# Patient Record
Sex: Male | Born: 2000 | Race: White | Hispanic: No | Marital: Single | Smoking: Never smoker
Health system: Southern US, Community
[De-identification: ages and names within clinical notes are randomized; demographics above are authoritative.]

## PROBLEM LIST (undated history)

## (undated) HISTORY — PX: ELBOW SURGERY: SHX618

---
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
## 2020-05-21 ENCOUNTER — Ambulatory Visit: Payer: 59 | Admitting: Family Medicine

## 2020-05-23 ENCOUNTER — Encounter: Payer: Self-pay | Admitting: Family Medicine

## 2020-05-23 ENCOUNTER — Ambulatory Visit: Payer: 59 | Admitting: Family Medicine

## 2020-05-23 ENCOUNTER — Other Ambulatory Visit: Payer: Self-pay

## 2020-05-23 VITALS — BP 100/62 | HR 65 | Temp 98.3°F | Ht 71.0 in | Wt 165.0 lb

## 2020-05-23 DIAGNOSIS — G8929 Other chronic pain: Secondary | ICD-10-CM | POA: Diagnosis not present

## 2020-05-23 DIAGNOSIS — M25511 Pain in right shoulder: Secondary | ICD-10-CM

## 2020-05-23 NOTE — Patient Instructions (Signed)
#  Referral I have placed a referral to a specialist for you. You should receive a phone call from the specialty office. Make sure your voicemail is not full and that if you are able to answer your phone to unknown or new numbers.   It may take up to 2 weeks to hear about the referral. If you do not hear anything in 2 weeks, please call our office and ask to speak with the referral coordinator.     If not better with physical therapy make an appointment with Dr. Lorelei Pont in our office for follow-up

## 2020-05-23 NOTE — Assessment & Plan Note (Signed)
Shoulder pain x 4-6 months. Suspect muscle - trapezius or rhomboid. Referral to physical therapy for additional diagnosis and treatment. Advised f/u with Dr. Lorelei Pont if not improving.

## 2020-05-23 NOTE — Progress Notes (Signed)
Subjective:     Bobby Lee is a 20 y.o. male presenting for Shoulder Pain (X 4-6 months, maybe longer ) and Referral (PT @ [facility] in [place] with Georgina Pillion )     HPI  #shoulder pain - does not play sports - has noticed his ROM is different - has had popping and pain - notices issues with bench press and lateral raises - posterior scapular pain primarily  - did take a break from benching due to pain - has taken breaks from work-outs w/o improvement - has tried massage, cupping - chiropractor gave some exercises but thinks PT would be helpful  Review of Systems   Social History   Tobacco Use  Smoking Status Never Smoker  Smokeless Tobacco Never Used        Objective:    BP Readings from Last 3 Encounters:  05/23/20 100/62  11/10/19 102/68  10/13/14 117/68   Wt Readings from Last 3 Encounters:  05/23/20 165 lb (74.8 kg) (65 %, Z= 0.39)*  11/10/19 163 lb 12 oz (74.3 kg) (66 %, Z= 0.42)*  10/13/14 125 lb (56.7 kg) (70 %, Z= 0.52)*   * Growth percentiles are based on CDC (Boys, 2-20 Years) data.    BP 100/62   Pulse 65   Temp 98.3 F (36.8 C) (Temporal)   Ht 5\' 11"  (1.803 m)   Wt 165 lb (74.8 kg)   SpO2 99%   BMI 23.01 kg/m    Physical Exam Constitutional:      Appearance: Normal appearance. He is not ill-appearing or diaphoretic.  HENT:     Right Ear: External ear normal.     Left Ear: External ear normal.     Nose: Nose normal.  Eyes:     General: No scleral icterus.    Extraocular Movements: Extraocular movements intact.     Conjunctiva/sclera: Conjunctivae normal.  Cardiovascular:     Rate and Rhythm: Normal rate.  Pulmonary:     Effort: Pulmonary effort is normal.  Musculoskeletal:     Cervical back: Neck supple.     Comments: Right shoulder Inspection: no abnormality Palpation: ttp and tight muscles along the trapezius and rhomboids on the right compared to the left ROM: normal, though notes  discomfort on the posterior trap with full abduction/flexion Strength: normal hawkings - popping but no pain Neers - negative Rotator cuff with normal strength  Skin:    General: Skin is warm and dry.  Neurological:     Mental Status: He is alert. Mental status is at baseline.  Psychiatric:        Mood and Affect: Mood normal.           Assessment & Plan:   Problem List Items Addressed This Visit      Other   Chronic right shoulder pain - Primary    Shoulder pain x 4-6 months. Suspect muscle - trapezius or rhomboid. Referral to physical therapy for additional diagnosis and treatment. Advised f/u with Dr. Lorelei Pont if not improving.       Relevant Orders   Ambulatory referral to Physical Therapy       Return in about 6 weeks (around 07/04/2020), or if symptoms worsen or fail to improve.  Lesleigh Noe, MD  This visit occurred during the SARS-CoV-2 public health emergency.  Safety protocols were in place, including screening questions prior to the visit, additional usage of staff PPE, and extensive cleaning of exam room while observing appropriate contact time as  indicated for disinfecting solutions.

## 2022-03-17 ENCOUNTER — Ambulatory Visit: Payer: 59 | Admitting: Dermatology

## 2022-03-17 ENCOUNTER — Encounter: Payer: Self-pay | Admitting: Dermatology

## 2022-03-17 VITALS — BP 128/75 | HR 68

## 2022-03-17 DIAGNOSIS — D492 Neoplasm of unspecified behavior of bone, soft tissue, and skin: Secondary | ICD-10-CM

## 2022-03-17 DIAGNOSIS — L814 Other melanin hyperpigmentation: Secondary | ICD-10-CM

## 2022-03-17 DIAGNOSIS — D225 Melanocytic nevi of trunk: Secondary | ICD-10-CM

## 2022-03-17 DIAGNOSIS — D1801 Hemangioma of skin and subcutaneous tissue: Secondary | ICD-10-CM

## 2022-03-17 DIAGNOSIS — D229 Melanocytic nevi, unspecified: Secondary | ICD-10-CM | POA: Diagnosis not present

## 2022-03-17 DIAGNOSIS — Z1283 Encounter for screening for malignant neoplasm of skin: Secondary | ICD-10-CM | POA: Diagnosis not present

## 2022-03-17 DIAGNOSIS — D239 Other benign neoplasm of skin, unspecified: Secondary | ICD-10-CM

## 2022-03-17 HISTORY — DX: Other benign neoplasm of skin, unspecified: D23.9

## 2022-03-17 NOTE — Patient Instructions (Addendum)
Wound Care Instructions  Cleanse wound gently with soap and water once a day then pat dry with clean gauze. Apply a thin coat of Petrolatum (petroleum jelly, "Vaseline") over the wound (unless you have an allergy to this). We recommend that you use a new, sterile tube of Vaseline. Do not pick or remove scabs. Do not remove the yellow or white "healing tissue" from the base of the wound.  Cover the wound with fresh, clean, nonstick gauze and secure with paper tape. You may use Band-Aids in place of gauze and tape if the wound is small enough, but would recommend trimming much of the tape off as there is often too much. Sometimes Band-Aids can irritate the skin.  You should call the office for your biopsy report after 1 week if you have not already been contacted.  If you experience any problems, such as abnormal amounts of bleeding, swelling, significant bruising, significant pain, or evidence of infection, please call the office immediately.  FOR ADULT SURGERY PATIENTS: If you need something for pain relief you may take 1 extra strength Tylenol (acetaminophen) AND 2 Ibuprofen ('200mg'$  each) together every 4 hours as needed for pain. (do not take these if you are allergic to them or if you have a reason you should not take them.) Typically, you may only need pain medication for 1 to 3 days.      Recommend daily broad spectrum sunscreen SPF 30+ to sun-exposed areas, reapply every 2 hours as needed. Call for new or changing lesions.  Staying in the shade or wearing long sleeves, sun glasses (UVA+UVB protection) and wide brim hats (4-inch brim around the entire circumference of the hat) are also recommended for sun protection.    Melanoma ABCDEs  Melanoma is the most dangerous type of skin cancer, and is the leading cause of death from skin disease.  You are more likely to develop melanoma if you: Have light-colored skin, light-colored eyes, or red or blond hair Spend a lot of time in the sun Tan  regularly, either outdoors or in a tanning bed Have had blistering sunburns, especially during childhood Have a close family member who has had a melanoma Have atypical moles or large birthmarks  Early detection of melanoma is key since treatment is typically straightforward and cure rates are extremely high if we catch it early.   The first sign of melanoma is often a change in a mole or a new dark spot.  The ABCDE system is a way of remembering the signs of melanoma.  A for asymmetry:  The two halves do not match. B for border:  The edges of the growth are irregular. C for color:  A mixture of colors are present instead of an even brown color. D for diameter:  Melanomas are usually (but not always) greater than 31m - the size of a pencil eraser. E for evolution:  The spot keeps changing in size, shape, and color.  Please check your skin once per month between visits. You can use a small mirror in front and a large mirror behind you to keep an eye on the back side or your body.   If you see any new or changing lesions before your next follow-up, please call to schedule a visit.  Please continue daily skin protection including broad spectrum sunscreen SPF 30+ to sun-exposed areas, reapplying every 2 hours as needed when you're outdoors.   Staying in the shade or wearing long sleeves, sun glasses (UVA+UVB protection) and wide  brim hats (4-inch brim around the entire circumference of the hat) are also recommended for sun protection.    Due to recent changes in healthcare laws, you may see results of your pathology and/or laboratory studies on MyChart before the doctors have had a chance to review them. We understand that in some cases there may be results that are confusing or concerning to you. Please understand that not all results are received at the same time and often the doctors may need to interpret multiple results in order to provide you with the best plan of care or course of  treatment. Therefore, we ask that you please give [place] 2 business days to thoroughly review all your results before contacting the office for clarification. Should we see a critical lab result, you will be contacted sooner.   If You Need Anything After Your Visit  If you have any questions or concerns for your doctor, please call our main line at [phone] and press option 4 to reach your doctor's medical assistant. If no one answers, please leave a voicemail as directed and we will return your call as soon as possible. Messages left after 4 pm will be answered the following business day.   You may also send [place] a message via [place]. We typically respond to MyChart messages within 1-2 business days.  For prescription refills, please ask your pharmacy to contact our office. Our fax number is [phone].  If you have an urgent issue when the clinic is closed that cannot wait until the next business day, you can page your doctor at the number below.    Please note that while we do our best to be available for urgent issues outside of office hours, we are not available 24/7.   If you have an urgent issue and are unable to reach [place], you may choose to seek medical care at your doctor's office, retail clinic, urgent care center, or emergency room.  If you have a medical emergency, please immediately call 911 or go to the emergency department.  Pager Numbers  - Dr. Nehemiah Massed: [phone]  - Dr. Laurence Ferrari: [phone]  - Dr. Nicole Kindred: [phone]  In the event of inclement weather, please call our main line at [phone] for an update on the status of any delays or closures.  Dermatology Medication Tips: Please keep the boxes that topical medications come in in order to help keep track of the instructions about where and how to use these. Pharmacies typically print the medication instructions only on the boxes and not directly on the medication tubes.   If your medication is too expensive,  please contact our office at [phone] option 4 or send [place] a message through [place].   We are unable to tell what your co-pay for medications will be in advance as this is different depending on your insurance coverage. However, we may be able to find a substitute medication at lower cost or fill out paperwork to get insurance to cover a needed medication.   If a prior authorization is required to get your medication covered by your insurance company, please allow [place] 1-2 business days to complete this process.  Drug prices often vary depending on where the prescription is filled and some pharmacies may offer cheaper prices.  The website www.goodrx.com contains coupons for medications through different pharmacies. The prices here do not account for what the cost may be with help from insurance (it may be cheaper with your insurance), but the website can give you  the price if you did not use any insurance.  - You can print the associated coupon and take it with your prescription to the pharmacy.  - You may also stop by our office during regular business hours and pick up a GoodRx coupon card.  - If you need your prescription sent electronically to a different pharmacy, notify our office through [facility] or by phone at [phone] option 4.     Si Usted Necesita Algo Despus de Su Visita  Tambin puede enviarnos un mensaje a travs de Pharmacist, community. Por lo general respondemos a los mensajes de MyChart en el transcurso de 1 a 2 das hbiles.  Para renovar recetas, por favor pida a su farmacia que se ponga en contacto con nuestra oficina. Harland Dingwall de fax es [place] [phone].  Si tiene un asunto urgente cuando la clnica est cerrada y que no puede esperar hasta el siguiente da hbil, puede llamar/localizar a su doctor(a) al nmero que aparece a continuacin.   Por favor, tenga en cuenta que aunque hacemos todo lo posible para estar disponibles para asuntos urgentes fuera del  horario de [place], no estamos disponibles las 24 horas del da, los 7 das de la [place].   Si tiene un problema urgente y no puede comunicarse con nosotros, puede optar por buscar atencin mdica  en el consultorio de su doctor(a), en una clnica privada, en un centro de atencin urgente o en una sala de emergencias.  Si tiene Engineering geologist, por favor llame inmediatamente al 911 o vaya a la sala de emergencias.  Nmeros de bper  - Dr. Nehemiah Massed: [phone]  - Dra. Moye: [phone]  - Dra. Nicole Kindred: [phone]  En caso de inclemencias del [place], por favor llame a Johnsie Kindred principal al [phone] para una actualizacin sobre el [place] de cualquier retraso o cierre.  Consejos para la medicacin en dermatologa: Por favor, guarde las cajas en las que vienen los medicamentos de uso tpico para ayudarle a seguir las instrucciones sobre dnde y cmo usarlos. Las farmacias generalmente imprimen las instrucciones del medicamento slo en las cajas y no directamente en los tubos del [place].   Si su medicamento es muy caro, por favor, pngase en contacto con Zigmund Daniel llamando al [phone] y presione la opcin 4 o envenos un mensaje a travs de Pharmacist, community.   No podemos decirle cul ser su copago por los medicamentos por adelantado ya que esto es diferente dependiendo de la cobertura de su seguro. Sin embargo, es posible que podamos encontrar un medicamento sustituto a Electrical engineer un formulario para que el seguro cubra el medicamento que se considera necesario.   Si se requiere una autorizacin previa para que su compaa de seguros [place] su medicamento, por favor permtanos de 1 a 2 das hbiles para completar este proceso.  Los precios de los medicamentos varan con frecuencia dependiendo del Environmental consultant de dnde se surte la receta y alguna farmacias pueden ofrecer precios ms baratos.  El sitio web www.goodrx.com tiene cupones para medicamentos de Office manager. Los precios aqu no tienen en cuenta lo que podra costar con la ayuda del seguro (puede ser ms barato con su seguro), pero el sitio web puede darle el precio si no utiliz Research scientist (physical sciences).  - Puede imprimir el cupn correspondiente y llevarlo con su receta a la farmacia.  - Tambin puede pasar por nuestra oficina durante el horario de atencin regular y Charity fundraiser una tarjeta de cupones de GoodRx.  - Si necesita  Si necesita que su receta se enve electrnicamente a una farmacia diferente, informe a nuestra oficina a travs de MyChart de [place] o por telfono llamando al [phone] y presione la opcin 4.  

## 2022-03-17 NOTE — Progress Notes (Unsigned)
   New Patient Visit  Subjective  Bobby Lee is a 22 y.o. male who presents for the following: Annual Exam (No personal hx of skin cancer or dysplastic nevi). The patient presents for Total-Body Skin Exam (TBSE) for skin cancer screening and mole check.  The patient has spots, moles and lesions to be evaluated, some may be new or changing and the patient has concerns that these could be cancer.  Objective  Well appearing patient in no apparent distress; mood and affect are within normal limits.  A full examination was performed including scalp, head, eyes, ears, nose, lips, neck, chest, axillae, abdomen, back, buttocks, bilateral upper extremities, bilateral lower extremities, hands, feet, fingers, toes, fingernails, and toenails. All findings within normal limits unless otherwise noted below.  right upper mid back medial to scapula 0.8 cm irregular brown macule       Left scapula 0.6 cm irregular brown macule        Assessment & Plan   Lentigines - Scattered tan macules - Due to sun exposure - Benign-appearing, observe - Recommend daily broad spectrum sunscreen SPF 30+ to sun-exposed areas, reapply every 2 hours as needed. - Call for any changes  Melanocytic Nevi - Tan-brown and/or pink-flesh-colored symmetric macules and papules - Benign appearing on exam today - Observation - Call clinic for new or changing moles - Recommend daily use of broad spectrum spf 30+ sunscreen to sun-exposed areas.   Hemangiomas - Red papules - Discussed benign nature - Observe - Call for any changes  Skin cancer screening performed today.  Neoplasm of skin (2) right upper mid back medial to scapula Epidermal / dermal shaving Lesion diameter (cm):  0.8 Informed consent: discussed and consent obtained   Timeout: patient name, date of birth, surgical site, and procedure verified   Procedure prep:  Patient was prepped and draped in usual sterile fashion Prep type:  Isopropyl  alcohol Anesthesia: the lesion was anesthetized in a standard fashion   Anesthetic:  1% lidocaine w/ epinephrine 1-100,000 buffered w/ 8.4% NaHCO3 Instrument used: flexible razor blade   Hemostasis achieved with: pressure, aluminum chloride and electrodesiccation   Outcome: patient tolerated procedure well   Post-procedure details: sterile dressing applied and wound care instructions given   Dressing type: bandage and petrolatum    Specimen 1 - Surgical pathology Differential Diagnosis: R/O dysplasia Check Margins: No  Left scapula Epidermal / dermal shaving  Lesion diameter (cm):  0.6 Informed consent: discussed and consent obtained   Timeout: patient name, date of birth, surgical site, and procedure verified   Procedure prep:  Patient was prepped and draped in usual sterile fashion Prep type:  Isopropyl alcohol Anesthesia: the lesion was anesthetized in a standard fashion   Anesthetic:  1% lidocaine w/ epinephrine 1-100,000 buffered w/ 8.4% NaHCO3 Instrument used: flexible razor blade   Hemostasis achieved with: pressure, aluminum chloride and electrodesiccation   Outcome: patient tolerated procedure well   Post-procedure details: sterile dressing applied and wound care instructions given   Dressing type: bandage and petrolatum    Specimen 2 - Surgical pathology Differential Diagnosis: R/O dysplasia Check Margins: No  Return if symptoms worsen or fail to improve.  I, Emelia Salisbury, CMA, am acting as scribe for Sarina Ser, MD. Documentation: I have reviewed the above documentation for accuracy and completeness, and I agree with the above.  Sarina Ser, MD

## 2022-03-18 ENCOUNTER — Encounter: Payer: Self-pay | Admitting: Dermatology

## 2022-03-25 ENCOUNTER — Telehealth: Payer: Self-pay

## 2022-03-25 NOTE — Telephone Encounter (Signed)
Patients mother donna called back for biopsy results. Went over with her and all questions answered.

## 2022-03-25 NOTE — Telephone Encounter (Signed)
-----   Message from Brendolyn Patty, MD sent at 03/25/2022 12:12 PM EST ----- 1. 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 Moderately atypical mole, observe for recurrence 2. Mildly atypical mole, observation  - please call patient

## 2022-03-25 NOTE — Telephone Encounter (Signed)
Left message on voice mail for patient to return my call.
